# Patient Record
Sex: Male | Born: 1955
Health system: Southern US, Academic
[De-identification: ages and names within clinical notes are randomized; demographics above are authoritative.]

## PROBLEM LIST (undated history)

## (undated) DIAGNOSIS — I2510 Atherosclerotic heart disease of native coronary artery without angina pectoris: Secondary | ICD-10-CM

## (undated) DIAGNOSIS — E7800 Pure hypercholesterolemia, unspecified: Secondary | ICD-10-CM

## (undated) HISTORY — PX: SEPTOPLASTY: SUR1290

## (undated) HISTORY — DX: Pure hypercholesterolemia, unspecified: E78.00

## (undated) HISTORY — PX: KNEE SURGERY: SHX244

## (undated) HISTORY — DX: Atherosclerotic heart disease of native coronary artery without angina pectoris: I25.10

---

## 2007-08-17 ENCOUNTER — Ambulatory Visit (HOSPITAL_COMMUNITY): Payer: Self-pay | Admitting: INTERNAL MEDICINE

## 2010-02-02 ENCOUNTER — Ambulatory Visit (HOSPITAL_COMMUNITY): Payer: Self-pay

## 2019-01-22 ENCOUNTER — Inpatient Hospital Stay (HOSPITAL_COMMUNITY)
Admission: EM | Admit: 2019-01-22 | Discharge: 2019-01-22 | Disposition: A | Discharge status: Home / Self Care | Payer: No Typology Code available for payment source | Source: Other Acute Inpatient Hospital

## 2019-01-22 DIAGNOSIS — I249 Acute ischemic heart disease, unspecified: Secondary | ICD-10-CM

## 2019-01-22 DIAGNOSIS — I214 Non-ST elevation (NSTEMI) myocardial infarction: Secondary | ICD-10-CM

## 2019-01-22 DIAGNOSIS — R7989 Other specified abnormal findings of blood chemistry: Secondary | ICD-10-CM

## 2019-01-22 DIAGNOSIS — E785 Hyperlipidemia, unspecified: Secondary | ICD-10-CM

## 2019-01-22 DIAGNOSIS — R0789 Other chest pain: Secondary | ICD-10-CM

## 2019-01-22 DIAGNOSIS — R112 Nausea with vomiting, unspecified: Secondary | ICD-10-CM

## 2019-04-23 ENCOUNTER — Ambulatory Visit (INDEPENDENT_AMBULATORY_CARE_PROVIDER_SITE_OTHER): Payer: Self-pay | Admitting: OTOLARYNGOLOGY

## 2019-05-07 ENCOUNTER — Encounter (INDEPENDENT_AMBULATORY_CARE_PROVIDER_SITE_OTHER): Payer: Self-pay | Admitting: OTOLARYNGOLOGY

## 2019-05-07 ENCOUNTER — Ambulatory Visit (INDEPENDENT_AMBULATORY_CARE_PROVIDER_SITE_OTHER): Payer: Self-pay | Admitting: OTOLARYNGOLOGY

## 2019-05-07 ENCOUNTER — Other Ambulatory Visit: Payer: Self-pay

## 2019-05-07 VITALS — HR 60 | Temp 97.3°F | Ht 69.0 in | Wt 220.0 lb

## 2019-05-07 DIAGNOSIS — H833X9 Noise effects on inner ear, unspecified ear: Secondary | ICD-10-CM

## 2019-05-07 NOTE — Progress Notes (Signed)
PATIENT NAME:  Xavier Burton  MRN:  [id number]  DOB:  1956/03/20  DATE OF SERVICE: 05/07/2019    Chief Complaint:  Hearing Loss      HPI:  Xavier Burton is a 63 y.o. male here for a workers' compensation exam.  He complains of bilateral hearing loss.  He has very little tinnitus.  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  He spent 25 years working at the [employer] on the [employer].  He wore hearing protection whenever it was available.  He has very few noisy hobbies.  He is a Therapist, nutritional, he rarely fires his gun except when target shooting.  He always wears hearing protection.  He struggles to hear women's voices.  He has never had ear surgery.  He has no otorrhea or pain.  He does have a history of hypertension and elevated cholesterol.        Past Medical History:  Past Medical History:   Diagnosis Date   . Coronary artery disease    . High cholesterol            Past Surgical History:  Past Surgical History:   Procedure Laterality Date   . KNEE SURGERY     . SEPTOPLASTY             Family History:  Family Medical History:     Problem Relation (Age of Onset)    Diabetes Mother    Heart Disease Mother, Father              Social History:  Social History     Tobacco Use   Smoking Status Never Smoker   Smokeless Tobacco Never Used     Social History     Substance and Sexual Activity   Alcohol Use Never   . Frequency: Never     Social History     Occupational History   . Not on file       Medications:  Outpatient Medications Marked as Taking for the 05/07/19 encounter (Office Visit) with Maree Erie, MD   Medication Sig   . aspirin (ECOTRIN) 81 mg Oral Tablet, Delayed Release (E.C.) TAKE 1 TABLET BY MOUTH EVERY DAY   . atorvastatin (LIPITOR) 80 mg Oral Tablet TAKE 1 TABLET BY MOUTH EVERY DAY   . hydroCHLOROthiazide (HYDRODIURIL) 12.5 mg Oral Tablet TAKE 1 TABLET BY MOUTH EVERY DAY   . KRILL OIL ORAL Take by mouth   . metoprolol succinate (TOPROL-XL) 25 mg Oral Tablet Sustained  Release 24 hr TAKE 1/2 TABLET BY MOUTH EVERY DAY   . nitroGLYCERIN (NITROSTAT) 0.4 mg Sublingual Tablet, Sublingual PLACE 1 TABLET UNDER TONGUE EVERY 5 MINS, UP TO 3 DOSES AS NEEDED FOR CHEST PAIN   . omeprazole (PRILOSEC) 20 mg Oral Capsule, Delayed Release(E.C.) TAKE 1 CAPSULE BY MOUTH EVERY DAY   . prasugreL (EFFIENT) 10 mg Oral Tablet TAKE 1 TABLET BY MOUTH EVERY DAY       Allergies:  No Known Allergies    Review of Systems:  Do you have any fevers: no   Any weight change: no   Change in your vision: no    Chest Pain: no   Shortness of Breath: no   Stomach pain: no   Urinary difficulity: no   Joint Pain: yes Explain Joint Pain: arthritis Skin Problems: no   Weakness or Numbness: yes Explain weakness or Numbness: hands Easy Bruising or Bleeding: yes Explain Bruising or Bleeding:  on blood thinners Excessive Thirst: no   Seasonal Allergies: yes    All other systems reviewed and found to be negative.    Physical Exam:  Pulse 60, temperature 36.3 C (97.3 F), temperature source Esophageal, height 1.753 m (5\' 9" ), weight 99.8 kg (220 lb), SpO2 99 %.  Body mass index is 32.49 kg/m.  General Appearance: Pleasant, cooperative, healthy, and in no acute distress.  Eyes: Conjunctivae/corneas clear, PERRLA, EOM's intact.  Head and Face: Normocephalic, atraumatic.  Face symmetric, no obvious lesions.   Pinnae: Normal shape and position.   External auditory canals:  Patent without inflammation.  Tympanic membranes:  Intact, translucent, midposition, middle ear aerated.  Nose:  External pyramid midline with nasal tip collapse. Septum midline. Mucosa normal. No purulence, polyps, or crusts.   Oral Cavity/Oropharynx: No mucosal lesions, masses, or pharyngeal asymmetry.  Hypopharynx/Larynx: voice normal.  Neck:  No palpable thyroid, salivary gland, or neck masses.  Heme/Lymph:  No cervical adenopathy.  Cardiovascular:  Good perfusion of upper extremities.  No cyanosis of the hands or fingers.  Lungs: No apparent stridorous  breathing. No acute distress.  Skin: Skin warm and dry.  Neurologic: Cranial nerves:  grossly intact.  Psychiatric:  Alert and oriented x 3.    Procedure:      Data Reviewed: Audiogram done by a certified audiologist on April 12, 2019, showed type A tympanograms bilaterally.  SRT is 10 decibels on the left and 15 decibel on the right.  There is a sloping high-frequency sensorineural hearing loss, which was symmetrical.        Assessment:  1. Noise-induced hearing loss        Plan:  Hearing protection  HAC/HAE  WC 10.72%      Maree Erie, MD 05/07/2019, 13:10    PCP:  No Pcp  No address on file   REF:  Demoss, Tomasa Blase, AuD  [address]  [place],  [place] [address]

## 2019-05-16 ENCOUNTER — Ambulatory Visit (INDEPENDENT_AMBULATORY_CARE_PROVIDER_SITE_OTHER): Payer: Self-pay | Admitting: OTOLARYNGOLOGY

## 2019-05-16 NOTE — Telephone Encounter (Signed)
Advised pt to contact his company where the claim was filed and to let clinic know if anything needs to be faxed. Pt verbalized understanding.

## 2019-05-16 NOTE — Telephone Encounter (Signed)
Regarding: Xavier Burton  ----- Message from Yehuda Savannah sent at 05/15/2019  3:15 PM EST -----  Patient wanted to make sure that he didn't need to do anything further for his hearing loss and hearing aids claim. He can be reached @ [phone] to advise, thank you.

## 2019-11-06 ENCOUNTER — Other Ambulatory Visit: Payer: Self-pay

## 2019-11-06 ENCOUNTER — Ambulatory Visit
Admission: RE | Admit: 2019-11-06 | Discharge: 2019-11-06 | Disposition: A | Discharge status: Home / Self Care | Payer: Self-pay | Source: Ambulatory Visit | Attending: EMERGENCY MEDICINE | Admitting: EMERGENCY MEDICINE

## 2019-11-06 ENCOUNTER — Ambulatory Visit (HOSPITAL_COMMUNITY): Payer: Self-pay

## 2019-11-06 DIAGNOSIS — R0689 Other abnormalities of breathing: Secondary | ICD-10-CM

## 2019-11-06 DIAGNOSIS — Z0000 Encounter for general adult medical examination without abnormal findings: Secondary | ICD-10-CM

## 2019-11-06 LAB — ARTERIAL BLOOD GAS/CO-OX (BLACK LUNG CLINIC)
%FIO2 (ARTERIAL): 21 %
%FIO2 (ARTERIAL): 21 %
A-aDO2: 32
A-aDO2: 24
BASE EXCESS (ARTERIAL): 0.7 mmol/L (ref 0.0–1.0)
BASE EXCESS (ARTERIAL): 2.3 mmol/L — ABNORMAL HIGH (ref 0.0–1.0)
BICARBONATE (ARTERIAL): 24.3 mmol/L (ref 18.0–26.0)
BICARBONATE (ARTERIAL): 26.5 mmol/L — ABNORMAL HIGH (ref 18.0–26.0)
CARBOXYHEMOGLOBIN: 1.3 % (ref 0.0–2.5)
CARBOXYHEMOGLOBIN: 1.6 % (ref 0.0–2.5)
HEMOGLOBIN: 15.1 g/dL (ref 12.0–18.0)
HEMOGLOBIN: 15.4 g/dL (ref 12.0–18.0)
MET-HEMOGLOBIN: 0.6 % (ref 0.0–2.0)
MET-HEMOGLOBIN: 0.5 % (ref 0.0–2.0)
O2 SATURATION (ARTERIAL): 95.4 %
O2 SATURATION (ARTERIAL): 95.7 %
O2CT: 20.2 % (ref 15.7–24.3)
O2CT: 20.8 % (ref 15.7–24.3)
OXYHEMOGLOBIN: 95.1 % (ref 85.0–98.0)
OXYHEMOGLOBIN: 95.9 % (ref 85.0–98.0)
PAO2/FIO2 RATIO: 352 (ref <=200)
PAO2/FIO2 RATIO: 367 (ref <=200)
PCO2 (ARTERIAL): 35 mm/Hg (ref 35.0–45.0)
PCO2 (ARTERIAL): 39 mm/Hg (ref 35.0–45.0)
PH (ARTERIAL): 7.45 (ref 7.35–7.45)
PH (ARTERIAL): 7.44 (ref 7.35–7.45)
PO2 (ARTERIAL): 74 mm/Hg (ref 72.0–100.0)
PO2 (ARTERIAL): 77 mm/Hg (ref 72.0–100.0)

## 2019-11-06 NOTE — Progress Notes (Signed)
Patient here for work-related exam.  See documentation in paper chart.    Ysidro Evert, MD 11/06/2019, 11:08

## 2019-11-08 ENCOUNTER — Ambulatory Visit (HOSPITAL_COMMUNITY): Payer: Self-pay

## 2019-11-08 ENCOUNTER — Other Ambulatory Visit (HOSPITAL_COMMUNITY): Payer: Self-pay | Admitting: EMERGENCY MEDICINE

## 2019-11-08 DIAGNOSIS — R0689 Other abnormalities of breathing: Secondary | ICD-10-CM

## 2021-03-30 ENCOUNTER — Other Ambulatory Visit (INDEPENDENT_AMBULATORY_CARE_PROVIDER_SITE_OTHER): Payer: Self-pay

## 2021-05-19 ENCOUNTER — Other Ambulatory Visit (INDEPENDENT_AMBULATORY_CARE_PROVIDER_SITE_OTHER): Payer: Self-pay

## 2023-07-25 IMAGING — NM THREE PHASE BONE SCAN
1 series · 6 of 6 positions shown · non-contrast
Comparison: None prior available. There is urinary contamination overlying the 
scrotum.

________________________________________________________________________________________________ 
THREE PHASE BONE SCAN, 07/25/2023 [DATE]: 
CLINICAL INDICATION: Right hip heterotopic ossification. Right hip pain since 
surgery in March 2022. No trauma.
TECHNIQUE: After the intravenous administration of 23.9 mCi of Sc-66m 
medronate, blood flow, blood pool and delayed images were obtained over the 
hips..

[Series 1000: flow · 4.80mm/px · 6 of 240 frames shown]
[frame 21/240  full-range]
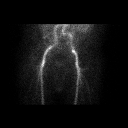
[frame 61/240  full-range]
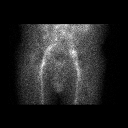
[frame 101/240  full-range]
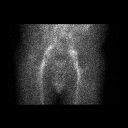
[frame 141/240  full-range]
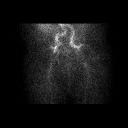
[frame 181/240  full-range]
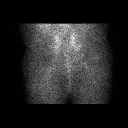
[frame 221/240  full-range]
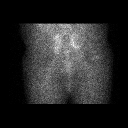

[6 of 6 positions shown; findings below may reference images not displayed]

FINDINGS: No abnormal blood flow or blood pool radiotracer accumulation. 
Delayed static views include photopenic defects both hips of known 
arthroplasties. There is globular delayed radiotracer uptake about the superior 
margin of the right greater trochanter. Bilateral periprosthetic delayed 
radiotracer uptake. This could correlate with heterotopic ossification described 
on order.
IMPRESSION: 1.  Globular delayed radiotracer uptake about the superior margin of the right 
greater trochanter may represent heterotopic ossification. Correlate with 
conventional radiographs. 
2.  Nonspecific delayed periprosthetic acetabular uptake may represent continued 
osseous remodeling versus other etiologies such as mechanical loosening.

## 2023-08-20 IMAGING — MR MRI LEFT SHOULDER WITHOUT CONTRAST
5 of 6 series · 27 of 40 positions shown · IV contrast (gadolinium)
Comparison: none

________________________________________________________________________________________________ 
MRI LEFT SHOULDER WITHOUT CONTRAST, 08/20/2023 [DATE]: 
CLINICAL INDICATION: Left shoulder pain for 3 months. COMPARISON:
TECHNIQUE: Multiplanar, multiecho position MR images of the shoulder were 
performed without intravenous gadolinium enhancement.

[Series 201: survey left · axial · left · 10.0mm · 0.71mm/px · z∈[-40,+125]mm · 4 of 15 slices shown]
[im 1/15]
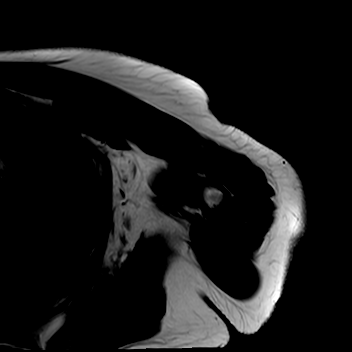
[im 5/15]
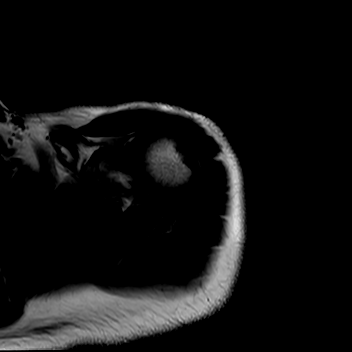
[im 10/15]
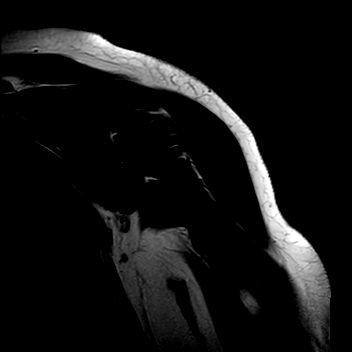
[im 15/15]
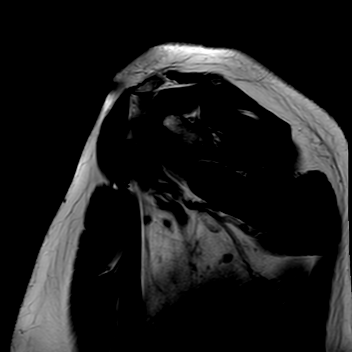

[Series 301: (person_name)_(person_name)_(person_name) · axial · left · 3.5mm · 0.42mm/px · z∈[-60,+61]mm · 8 of 32 slices shown]
[im 1/32]
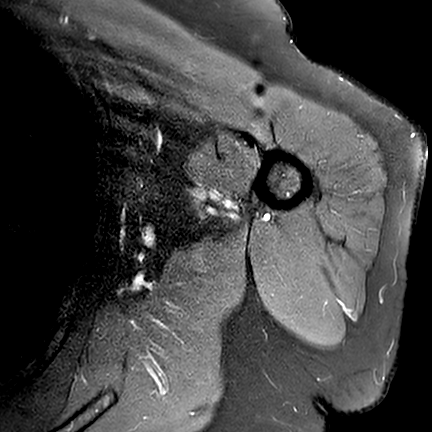
[im 5/32]
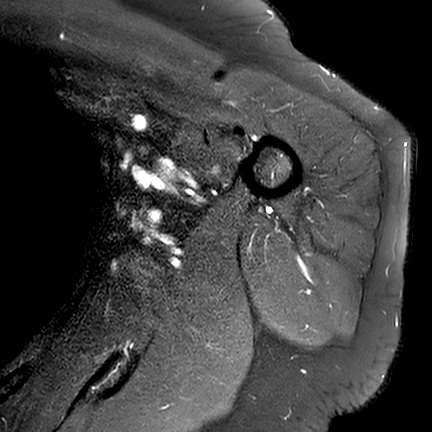
[im 9/32]
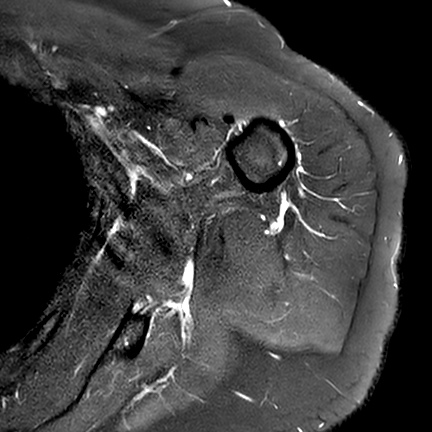
[im 14/32]
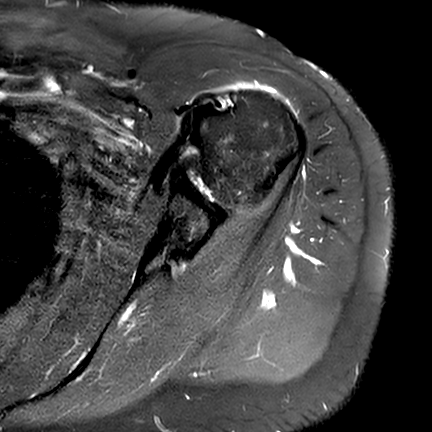
[im 18/32]
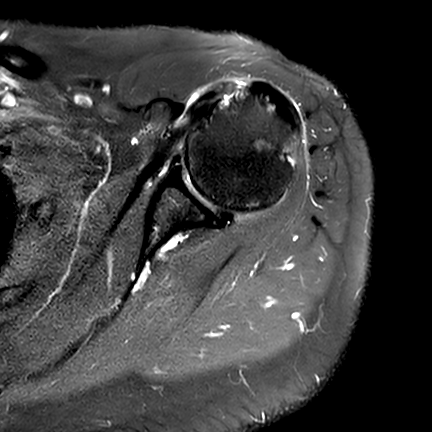
[im 23/32]
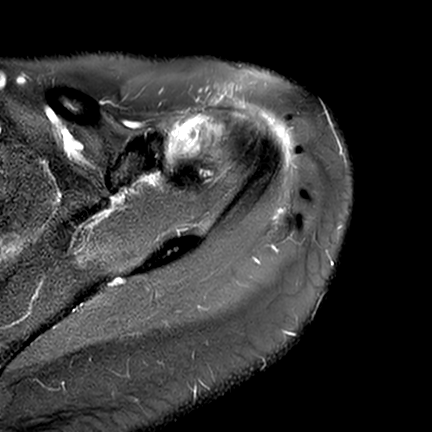
[im 27/32]
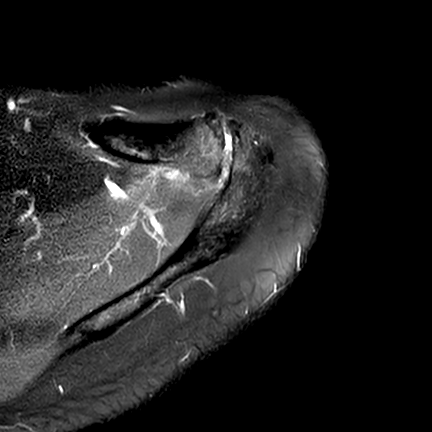
[im 32/32]
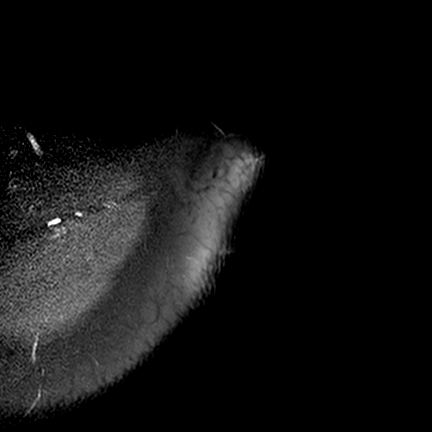

[Series 401: t2_fs_sag left · oblique · left · 3.5mm · 0.37mm/px · 8 of 30 slices shown]
[im 1/30]
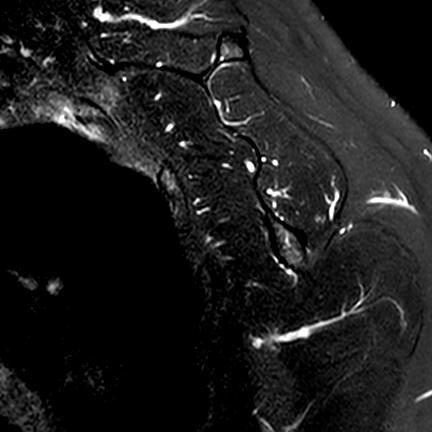
[im 5/30]
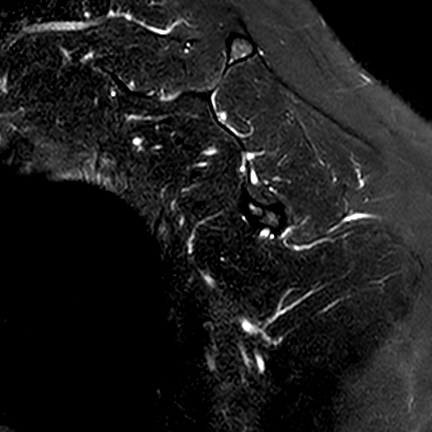
[im 9/30]
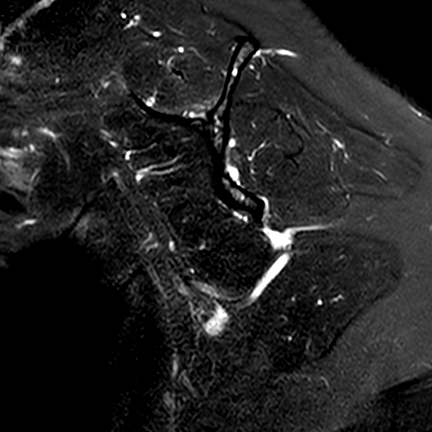
[im 13/30]
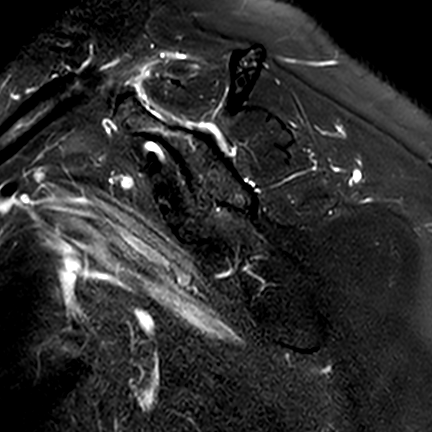
[im 17/30]
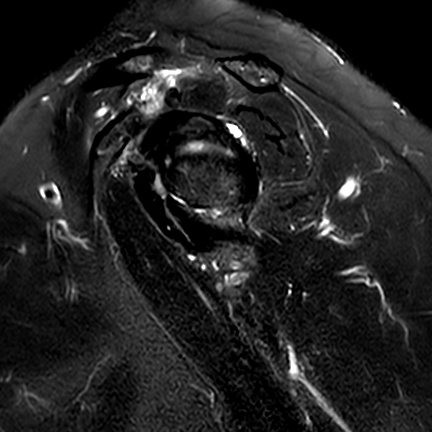
[im 21/30]
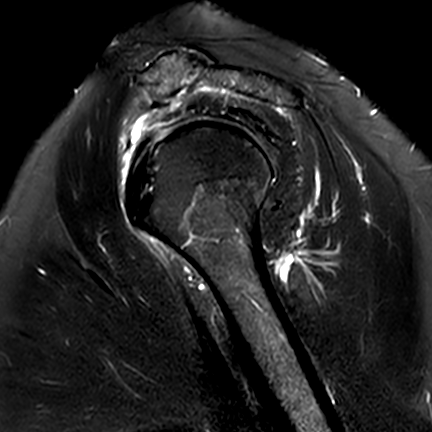
[im 25/30]
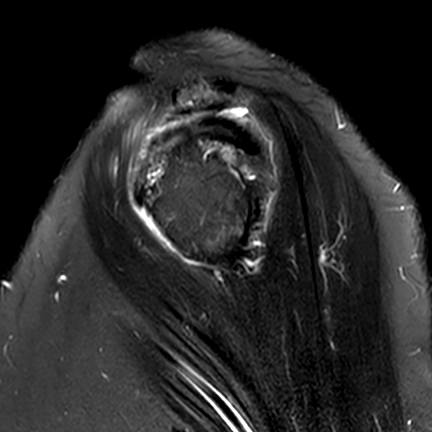
[im 30/30]
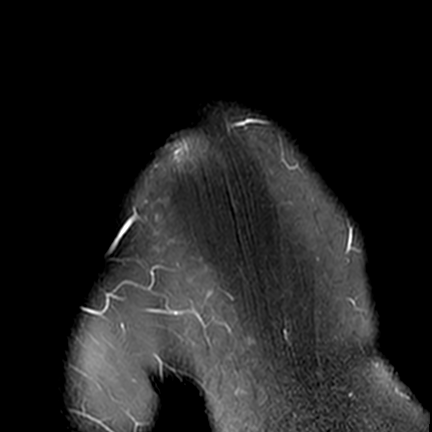

[Series 501: pd_fs_cor left · oblique · left · 3.5mm · 0.40mm/px · 6 of 24 slices shown]
[im 1/24]
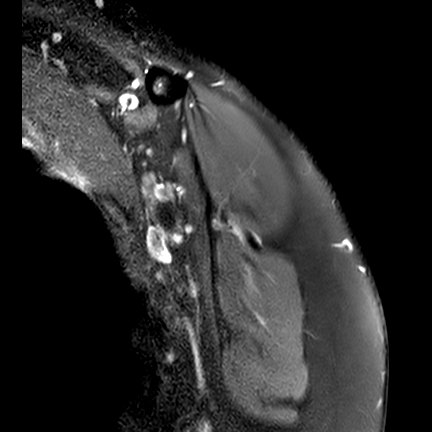
[im 5/24]
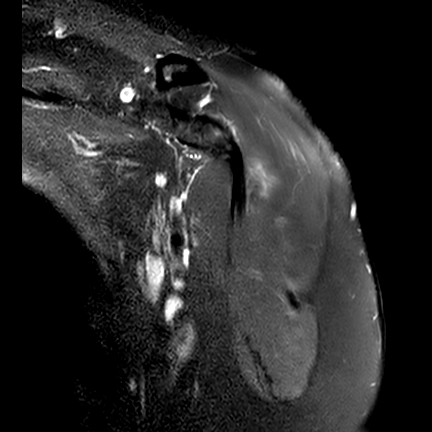
[im 10/24]
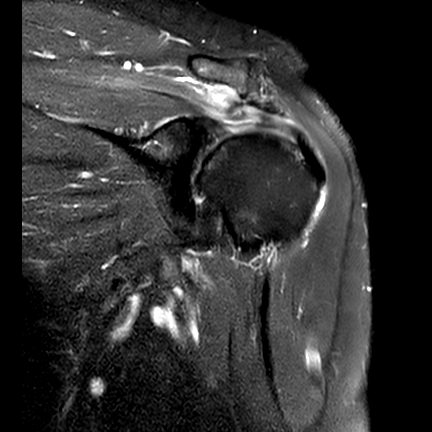
[im 14/24]
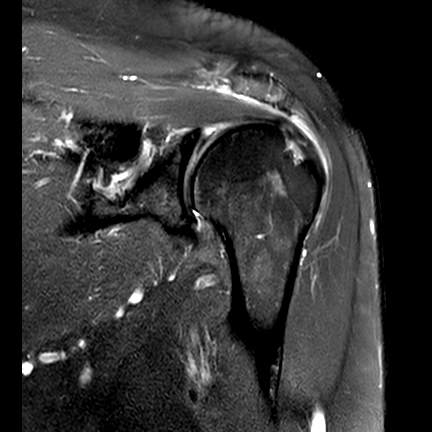
[im 19/24]
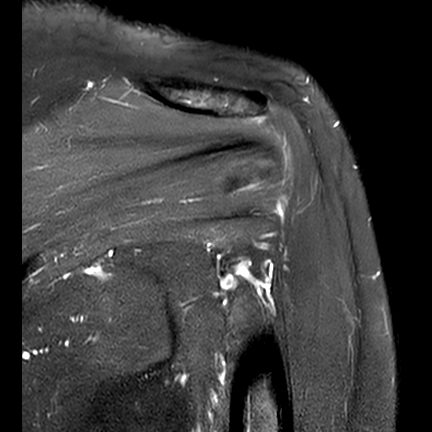
[im 24/24]
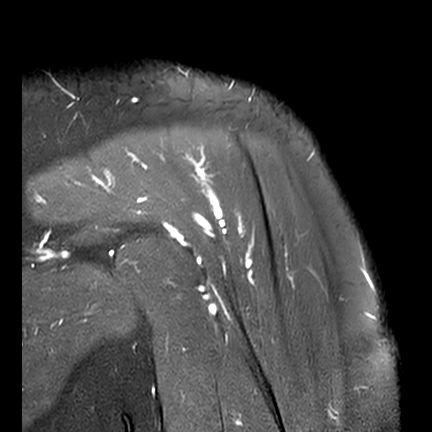

[Series 601: t1_sag left · oblique · left · 3.5mm · 0.31mm/px · 1 of 30 slices shown]
[im 1/30]
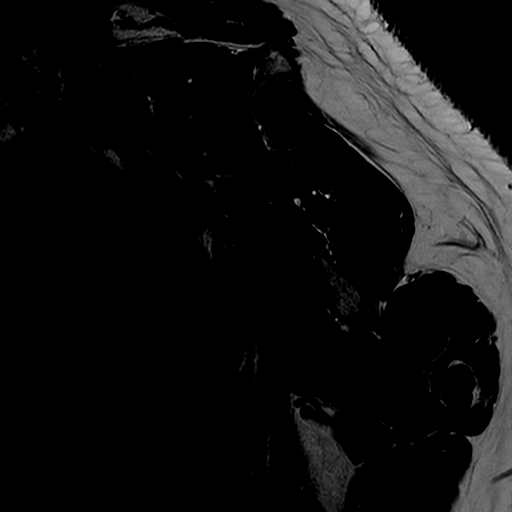

[27 of 40 positions shown; findings below may reference images not displayed]

FINDINGS: ROTATOR CUFF: There is focal high-grade partial tearing of both the articular 
and bursal surface of the supraspinatus tendon measuring approximately 3 mm AP 
The supraspinatus, infraspinatus, subscapularis and teres minor tendons are 
otherwise intact. No high-grade partial or full-thickness rotator cuff tear. The 
rotator cuff musculature is symmetric without mass, signal abnormality or 
atrophy. 
ACROMIOCLAVICULAR JOINT: Mild hypertrophic changes of the acromioclavicular 
joint with mild mass effect effect upon the supraspinatus. The coracoacromial 
ligament is intact without prominent spurring at the acromial attachment. The 
acromioclavicular and coracoclavicular ligaments are preserved. The acromium is 
normal in morphology. 
GLENOHUMERAL JOINT: Mild articular cartilaginous loss of the glenohumeral 
articulation. There is multifocal degenerative tearing/fraying of the labrum. 
There is associated 8 mm cyst with the posterior labrum.  The intra-articular 
portion of the long head of the biceps tendon is negative. No shoulder joint 
effusion. 
BONES: The bone marrow signal intensity is negative for fracture. No Hill-Sachs 
defect. Subcortical cystic change of the humeral head. 
ADDITIONAL FINDINGS: There is a small amount of fluid within the 
subacromial-subdeltoid space. The axillary region is negative. Subcutaneous 
tissues are negative.
IMPRESSION: 1.  Small focal high-grade partial tearing of the supraspinatus tendon 
insertion. 
2.  Mild degenerative changes of the glenohumeral articulation. 
3.  Mild AC joint arthropathy with mild narrowing of the supraspinatus outlet. 
4.  Trace subacromial-subdeltoid bursitis.
